# Patient Record
Sex: Male | Born: 1960 | Race: White | Hispanic: Refuse to answer | Smoking: Never smoker
Health system: Southern US, Community
[De-identification: ages and names within clinical notes are randomized; demographics above are authoritative.]

## PROBLEM LIST (undated history)

## (undated) DIAGNOSIS — H9319 Tinnitus, unspecified ear: Secondary | ICD-10-CM

## (undated) DIAGNOSIS — N189 Chronic kidney disease, unspecified: Secondary | ICD-10-CM

## (undated) DIAGNOSIS — E119 Type 2 diabetes mellitus without complications: Secondary | ICD-10-CM

## (undated) DIAGNOSIS — E785 Hyperlipidemia, unspecified: Secondary | ICD-10-CM

## (undated) DIAGNOSIS — I10 Essential (primary) hypertension: Secondary | ICD-10-CM

## (undated) DIAGNOSIS — B20 Human immunodeficiency virus [HIV] disease: Secondary | ICD-10-CM

## (undated) HISTORY — DX: Tinnitus, unspecified ear: H93.19

## (undated) HISTORY — DX: Essential (primary) hypertension: I10

## (undated) HISTORY — PX: MICRODISCECTOMY LUMBAR: SUR864

## (undated) HISTORY — DX: Type 2 diabetes mellitus without complications: E11.9

## (undated) HISTORY — DX: Chronic kidney disease, unspecified: N18.9

## (undated) HISTORY — DX: Hyperlipidemia, unspecified: E78.5

## (undated) HISTORY — DX: Human immunodeficiency virus (HIV) disease: B20

---

## 2003-09-16 ENCOUNTER — Emergency Department: Admit: 2003-09-16 | Payer: Self-pay | Source: Emergency Department | Admitting: Internal Medicine

## 2008-01-29 ENCOUNTER — Ambulatory Visit
Admit: 2008-01-29 | Disposition: A | Discharge status: Home / Self Care | Payer: Self-pay | Source: Ambulatory Visit | Admitting: Specialist

## 2009-07-15 ENCOUNTER — Ambulatory Visit: Admit: 2009-07-15 | Disposition: A | Discharge status: Home / Self Care | Payer: Self-pay | Source: Ambulatory Visit

## 2009-07-15 LAB — CBC AND DIFFERENTIAL
Basophils Absolute: 0 /mm3 (ref 0.0–0.2)
Basophils: 1 % (ref 0–2)
Eosinophils Absolute: 0.2 /mm3 (ref 0.0–0.7)
Eosinophils: 3 % (ref 0–5)
Granulocytes Absolute: 4.8 /mm3 (ref 1.8–8.1)
Hematocrit: 45.1 % (ref 42.0–52.0)
Hgb: 16 G/DL (ref 13.0–17.0)
Immature Granulocytes Absolute: 0 CUMM (ref 0.0–0.0)
Immature Granulocytes: 0 % (ref 0–1)
Lymphocytes Absolute: 2.2 /mm3 (ref 0.5–4.4)
Lymphocytes: 28 % (ref 15–41)
MCH: 34.9 PG — ABNORMAL HIGH (ref 28.0–32.0)
MCHC: 35.5 G/DL (ref 32.0–36.0)
MCV: 98.5 FL (ref 80.0–100.0)
MPV: 12.2 FL (ref 9.4–12.3)
Monocytes Absolute: 0.6 /mm3 (ref 0.0–1.2)
Monocytes: 8 % (ref 0–11)
Neutrophils %: 61 % (ref 52–75)
Platelets: 174 /mm3 (ref 140–400)
RBC: 4.58 /mm3 — ABNORMAL LOW (ref 4.70–6.00)
RDW: 13.5 % (ref 11.5–15.0)
WBC: 7.79 /mm3 (ref 3.50–10.80)

## 2009-07-15 LAB — LIPID PANEL
Cholesterol: 166 mg/dL (ref ?–199)
HDL: 48 mg/dL (ref 40–65)
LDL Calculated: 84 mg/dL (ref 0–99)
Triglycerides: 170 mg/dL — ABNORMAL HIGH (ref 34–149)
VLDL Calculated: 34 mg/dL — ABNORMAL HIGH (ref 7–30)

## 2009-07-15 LAB — COMPREHENSIVE METABOLIC PANEL
ALT: 33 U/L (ref 7–56)
AST (SGOT): 26 U/L (ref 5–40)
Albumin/Globulin Ratio: 1.8 (ref 1.1–1.8)
Albumin: 4.8 G/DL (ref 3.7–5.1)
Alkaline Phosphatase: 168 U/L — ABNORMAL HIGH (ref 43–122)
BUN: 14 MG/DL (ref 7–21)
Bilirubin, Total: 0.5 MG/DL (ref 0.2–1.3)
CO2: 29 MEQ/L (ref 22–31)
Calcium: 9.5 MG/DL (ref 8.6–10.2)
Chloride: 103 MEQ/L (ref 98–107)
Creatinine: 1 MG/DL (ref 0.5–1.4)
Globulin: 2.7 G/DL (ref 2.0–3.7)
Glucose: 142 MG/DL — ABNORMAL HIGH (ref 65–110)
Potassium: 4.3 MEQ/L (ref 3.6–5.0)
Protein, Total: 7.5 G/DL (ref 6.0–8.0)
Sodium: 143 MEQ/L (ref 136–143)

## 2009-07-15 LAB — CH/HDL: Cholesterol / HDL Ratio: 3.5 RATIO

## 2009-07-15 LAB — GFR

## 2009-07-18 LAB — HIV-1 RNA, QUANTITATIVE, PCR-SOFT

## 2009-07-18 LAB — T-HELPER CELLS (CD4) COUNT

## 2009-09-29 ENCOUNTER — Ambulatory Visit: Admit: 2009-09-29 | Disposition: A | Discharge status: Home / Self Care | Payer: Self-pay | Source: Ambulatory Visit

## 2009-09-29 LAB — CBC
Hematocrit: 46.5 % (ref 42.0–52.0)
Hgb: 16.6 g/dL (ref 13.0–17.0)
MCH: 35.3 pg — ABNORMAL HIGH (ref 28.0–32.0)
MCHC: 35.7 g/dL (ref 32.0–36.0)
MCV: 98.9 fL (ref 80.0–100.0)
MPV: 12.5 fL — ABNORMAL HIGH (ref 9.4–12.3)
Platelets: 243 10*3/uL (ref 140–400)
RBC: 4.7 10*6/uL (ref 4.70–6.00)
RDW: 13 % (ref 12–15)
WBC: 7.78 10*3/uL (ref 3.50–10.80)

## 2009-09-30 LAB — HIV-1 RNA, QUANTITATIVE, PCR-SOFT: HIV-1 RNA Quantification, P: DETECTED copies/mL

## 2009-12-09 ENCOUNTER — Ambulatory Visit: Admit: 2009-12-09 | Disposition: A | Discharge status: Home / Self Care | Payer: Self-pay | Source: Ambulatory Visit

## 2009-12-09 LAB — COMPREHENSIVE METABOLIC PANEL
ALT: 51 U/L (ref 7–56)
AST (SGOT): 28 U/L (ref 5–40)
Albumin/Globulin Ratio: 1.8 (ref 1.1–1.8)
Albumin: 4.8 g/dL (ref 3.7–5.1)
Alkaline Phosphatase: 203 U/L — ABNORMAL HIGH (ref 43–122)
BUN: 13 mg/dL (ref 7–21)
Bilirubin, Total: 2.6 mg/dL — ABNORMAL HIGH (ref 0.2–1.3)
CO2: 29 mEq/L (ref 22–31)
Calcium: 9.4 mg/dL (ref 8.6–10.2)
Chloride: 103 mEq/L (ref 98–107)
Creatinine: 0.9 mg/dL (ref 0.5–1.4)
Globulin: 2.7 g/dL (ref 2.0–3.7)
Glucose: 185 mg/dL — ABNORMAL HIGH (ref 70–100)
Potassium: 4.2 mEq/L (ref 3.6–5.0)
Protein, Total: 7.5 g/dL (ref 6.0–8.0)
Sodium: 144 mEq/L — ABNORMAL HIGH (ref 136–143)

## 2009-12-09 LAB — CBC AND DIFFERENTIAL
Baso(Absolute): 0.04 10*3/uL (ref 0.00–0.20)
Basophils: 1 % (ref 0–2)
Eosinophils Absolute: 0.3 10*3/uL (ref 0.00–0.70)
Eosinophils: 4 % (ref 0–5)
Hematocrit: 48.4 % (ref 42.0–52.0)
Hgb: 17.8 g/dL — ABNORMAL HIGH (ref 13.0–17.0)
Immature Granulocytes Absolute: 0.02 10*3/uL
Immature Granulocytes: 0 % (ref 0–1)
Lymphocytes Absolute: 2.1 10*3/uL (ref 0.50–4.40)
Lymphocytes: 30 % (ref 15–41)
MCH: 35.7 pg — ABNORMAL HIGH (ref 28.0–32.0)
MCHC: 36.8 g/dL — ABNORMAL HIGH (ref 32.0–36.0)
MCV: 97 fL (ref 80.0–100.0)
MPV: 12.6 fL — ABNORMAL HIGH (ref 9.4–12.3)
Monocytes Absolute: 0.6 10*3/uL (ref 0.00–1.20)
Monocytes: 8 % (ref 0–11)
Neutrophils Absolute: 4.01 10*3/uL
Neutrophils: 57 % (ref 52–75)
Platelets: 207 10*3/uL (ref 140–400)
RBC: 4.99 10*6/uL (ref 4.70–6.00)
RDW: 13 % (ref 12–15)
WBC: 7.05 10*3/uL (ref 3.50–10.80)

## 2009-12-09 LAB — LIPID PANEL
Cholesterol / HDL Ratio: 3.5 Index
Cholesterol: 150 mg/dL (ref 0–199)
HDL: 43 mg/dL (ref >40)
LDL Calculated: 70 mg/dL (ref 0–99)
Triglycerides: 184 mg/dL — ABNORMAL HIGH (ref 34–149)
VLDL Calculated: 37 mg/dL (ref 10–40)

## 2009-12-09 LAB — GFR: EGFR: >60

## 2009-12-09 LAB — HEMOLYSIS INDEX: Hemolysis Index: 16 Index (ref 0–18)

## 2009-12-09 LAB — CK: Creatine Kinase (CK): 100 U/L (ref 0–297)

## 2009-12-11 LAB — T-HELPER CELLS (CD4) COUNT
CD3 % (T Cells): 68 % (ref 59–83)
CD3 (T Cells): 1597 cells/mcL (ref 677–2383)
CD4 % (Helper Cells): 36 % (ref 31–59)
CD4 (Helper Cells): 831 cells/mcL (ref 424–1509)
CD45 Lymphocyte Count Flowcytometry: 2.34 10*3/uL (ref 0.99–3.15)
CD8 % (Suppressor Cells): 32 % (ref 12–38)
CD8 (Suppressor Cells): 739 cells/mcL (ref 169–955)
Helper / Suppressor Ratio: 1.12 (ref >=1.0)

## 2009-12-12 LAB — HIV-1 RNA, QUANTITATIVE, PCR-SOFT: HIV-1 RNA Quantification, P: NOT DETECTED copies/mL

## 2010-03-14 ENCOUNTER — Ambulatory Visit
Admit: 2010-03-14 | Disposition: A | Discharge status: Home / Self Care | Payer: Self-pay | Source: Ambulatory Visit | Admitting: Infectious Disease

## 2013-03-23 ENCOUNTER — Ambulatory Visit (INDEPENDENT_AMBULATORY_CARE_PROVIDER_SITE_OTHER): Payer: Commercial Managed Care - POS | Admitting: Family Medicine

## 2013-03-23 ENCOUNTER — Encounter (INDEPENDENT_AMBULATORY_CARE_PROVIDER_SITE_OTHER): Payer: Self-pay

## 2013-03-23 VITALS — BP 148/96 | HR 79 | Temp 95.3°F | Resp 17 | Ht 70.0 in | Wt 158.0 lb

## 2013-03-23 MED ORDER — CIPROFLOXACIN-HYDROCORTISONE 0.2-1 % OT SUSP
3.0000 [drp] | Freq: Two times a day (BID) | OTIC | Status: AC
Start: 2013-03-23 — End: 2013-03-30

## 2013-03-23 NOTE — Patient Instructions (Signed)
External Ear Infection [Adult]    This is an infection in the ear canal due to an overgrowth of bacteria or fungus. This often occurs a few days after water gets trapped in the ear canal (swimming or bathing). It may also occur after cleaning too deeply in the ear canal with a cotton swab or other object. Sometimes hair care products get into the ear canal and cause this problem.  There may be itching, redness, drainage, or swelling of the ear canal and temporary loss of hearing.  Home Care:   Do not try to clean the ear canal. That could push pus and bacteria deeper into the canal.   Use the drops prescribed to reduce swelling and fight the infection. If an EAR WICK was placed in the ear canal, apply drops right onto the end of the wick. The wick will draw the medicine into the ear canal even if it is swollen closed.   Do not allow water to get into your ear when bathing. No swimming during this time.   A cotton ball may be loosely placed in the outer ear to absorb any drainage.   You may use acetaminophen (Tylenol) or ibuprofen (Motrin, Advil) to control pain, unless another medicine was prescribed. [NOTE: If you have chronic liver or kidney disease or ever had a stomach ulcer or GI bleeding, talk with your doctor before using these medicines.]  Preventing Future Infections:  You can usually avoid this problem by using an eardrop that removes the water from your ear canal when you feel there is water trapped there. You can get these drops over the counter (Swim Ear, Aqua Ear and other brands).  Follow Up  with your doctor or this facility in one week or as instructed by our staff.  Get Prompt Medical Attention  if any of the following occur:   Ear pain becomes worse or does not begin to improve after 3 days of treatment   Redness or swelling of the outer ear occurs or gets worse   Headache, painful or stiff neck,   Feeling drowsy or confused   Fever of 100.4F (38C) or higher, or as directed by your  healthcare provider   Seizure   2000-2014 [employer], [address], [place] [address]. All rights reserved. This information is not intended as a substitute for professional medical care. Always follow your healthcare professional's instructions.

## 2013-03-23 NOTE — Progress Notes (Signed)
Subjective:       Patient ID: James Day is a 52 y.o. male.    HPI    52yo wm presents with 5 days of left ear pain and discomfort.  Used debrox, as he has had similar problems in past and that always seemed to help, but this time no relief.  Noticed ringing yesterday.  No hearing loss. O/W feels well.  Has not been swimming recently.    The following portions of the patient's history were reviewed and updated as appropriate: allergies, current medications, past family history, past medical history, past surgical history and problem list.    Review of Systems   Constitutional: Negative for fever.   HENT: Negative for hearing loss, congestion, sore throat, rhinorrhea and ear discharge.            Objective:     Physical Exam   Constitutional: He appears well-developed and well-nourished. No distress.   HENT:   Head: Normocephalic and atraumatic.   Right Ear: Tympanic membrane and external ear normal.   Left Ear: Tympanic membrane normal. There is swelling (and mild erythema of canal, with small amount white/yellow debris). No tenderness. No mastoid tenderness. Tympanic membrane is not injected and not erythematous.  No middle ear effusion.   Nose: Nose normal.   Mouth/Throat: Oropharynx is clear and moist.   Eyes: Conjunctivae normal are normal.   Neck: Normal range of motion. Neck supple.   Lymphadenopathy:     He has no cervical adenopathy.           Assessment:       1. Otitis externa, left  ciprofloxacin-hydrocortisone (CIPRO HC) otic suspension           Plan:       F/u if no improvement or any worsening over next 2-3 days

## 2013-04-22 ENCOUNTER — Other Ambulatory Visit: Payer: Self-pay | Admitting: Otolaryngology

## 2013-04-24 ENCOUNTER — Ambulatory Visit: Payer: Commercial Managed Care - POS | Attending: Otolaryngology

## 2013-04-24 DIAGNOSIS — R42 Dizziness and giddiness: Secondary | ICD-10-CM | POA: Insufficient documentation

## 2013-04-24 DIAGNOSIS — K119 Disease of salivary gland, unspecified: Secondary | ICD-10-CM | POA: Insufficient documentation

## 2013-04-24 MED ORDER — GADOBUTROL 1 MMOL/ML IV SOLN
8.0000 mL | Freq: Once | INTRAVENOUS | Status: AC | PRN
Start: 2013-04-24 — End: 2013-04-24
  Administered 2013-04-24: 8 mmol via INTRAVENOUS
  Filled 2013-04-24: qty 10

## 2013-05-09 ENCOUNTER — Emergency Department: Payer: Commercial Managed Care - POS

## 2013-05-09 ENCOUNTER — Emergency Department
Admission: EM | Admit: 2013-05-09 | Discharge: 2013-05-09 | Disposition: A | Discharge status: Home / Self Care | Payer: Commercial Managed Care - POS | Attending: Emergency Medical Services | Admitting: Emergency Medical Services

## 2013-05-09 DIAGNOSIS — E785 Hyperlipidemia, unspecified: Secondary | ICD-10-CM | POA: Insufficient documentation

## 2013-05-09 DIAGNOSIS — Z21 Asymptomatic human immunodeficiency virus [HIV] infection status: Secondary | ICD-10-CM | POA: Insufficient documentation

## 2013-05-09 DIAGNOSIS — X500XXA Overexertion from strenuous movement or load, initial encounter: Secondary | ICD-10-CM | POA: Insufficient documentation

## 2013-05-09 DIAGNOSIS — IMO0002 Reserved for concepts with insufficient information to code with codable children: Secondary | ICD-10-CM | POA: Insufficient documentation

## 2013-05-09 DIAGNOSIS — E119 Type 2 diabetes mellitus without complications: Secondary | ICD-10-CM | POA: Insufficient documentation

## 2013-05-09 DIAGNOSIS — N189 Chronic kidney disease, unspecified: Secondary | ICD-10-CM | POA: Insufficient documentation

## 2013-05-09 DIAGNOSIS — Z7982 Long term (current) use of aspirin: Secondary | ICD-10-CM | POA: Insufficient documentation

## 2013-05-09 DIAGNOSIS — I129 Hypertensive chronic kidney disease with stage 1 through stage 4 chronic kidney disease, or unspecified chronic kidney disease: Secondary | ICD-10-CM | POA: Insufficient documentation

## 2013-05-09 MED ORDER — HYDROCODONE-ACETAMINOPHEN 5-300 MG PO TABS
1.0000 | ORAL_TABLET | Freq: Four times a day (QID) | ORAL | Status: DC | PRN
Start: 2013-05-09 — End: 2014-01-31

## 2013-05-09 NOTE — ED Provider Notes (Addendum)
Physician/Midlevel provider first contact with patient: 05/09/13 0934     52 yom, c/o right groin pain that radiates to right thigh and buttock x 3 months and worsening.  Pt reports walking on a marble stone in [place] 3 months ago, sprained the right hip and pain ever since.  Denies recent back pain or injury, urinary or bowel dysfunction, muscle wasting or weakness, or paresthesias, is ambulatory but limping lately.     History     Chief Complaint   Patient presents with   . Leg Pain     Patient is a 52 y.o. male presenting with leg pain. The history is provided by the patient. No language interpreter was used.   Leg Pain   The incident occurred more than 1 week ago. The injury mechanism was torsion. The pain is present in the right hip. The quality of the pain is described as burning and aching. The pain is at a severity of 6/10. The pain is moderate. The pain has been intermittent since onset. Pertinent negatives include no numbness, no inability to bear weight, no loss of motion, no muscle weakness and no tingling. The symptoms are aggravated by bearing weight and activity. He has tried NSAIDs and ice for the symptoms. The treatment provided mild relief.       Past Medical History   Diagnosis Date   . Diabetes mellitus    . Hypertension    . Hyperlipidemia    . Chronic kidney disease    . HIV disease    . Tinnitus        Past Surgical History   Procedure Date   . Microdiscectomy lumbar        Family History   Problem Relation Age of Onset   . Stroke Mother        Social  History   Substance Use Topics   . Smoking status: Never Smoker    . Smokeless tobacco: Not on file   . Alcohol Use: No       .     No Known Allergies    Current/Home Medications    ABACAVIR-LAMIVUDINE (EPIZICOM) 600-300 MG PER TABLET    Take 1 tablet by mouth daily.    ASPIRIN 81 MG TABLET    Take 81 mg by mouth daily.    ATAZANAVIR (REYATAZ) 300 MG CAPSULE    Take 300 mg by mouth every morning with breakfast.    ATORVASTATIN (LIPITOR) 10 MG  TABLET    Take 10 mg by mouth daily.    CALCIUM-VITAMIN D (OSCAL-500) 500-200 MG-UNIT PER TABLET    Take 1 tablet by mouth 2 (two) times daily.    LINAGLIPTIN (TRADJENTA) 5 MG TAB    Take by mouth.    MULTIPLE VITAMIN (MULTIVITAMIN) TABLET    Take 1 tablet by mouth daily.    PREDNISONE (DELTASONE) 10 MG TABLET PACK    Take by mouth daily.    RITONAVIR (NORVIR) 100 MG CAPSULE    Take by mouth 2 (two) times daily.    VALACYCLOVIR HCL (VALTREX PO)    Take by mouth.    VALSARTAN-HYDROCHLOROTHIAZIDE (DIOVAN-HCT) 160-12.5 MG PER TABLET    Take 1 tablet by mouth daily.        Review of Systems   Constitutional: Negative for fever, chills, activity change and fatigue.   HENT: Negative for congestion, sore throat, rhinorrhea, trouble swallowing, neck pain and neck stiffness.    Eyes: Negative for visual disturbance.   Respiratory: Negative  for cough and shortness of breath.    Cardiovascular: Negative for chest pain.   Gastrointestinal: Negative for nausea, vomiting and abdominal pain.   Genitourinary: Negative for dysuria and difficulty urinating.   Musculoskeletal: Positive for joint swelling and gait problem. Negative for back pain.   Skin: Negative for color change and rash.   Neurological: Negative for dizziness, tingling, weakness, numbness and headaches.   Psychiatric/Behavioral: Negative for behavioral problems and confusion.       Physical Exam    BP 117/60  Pulse 86  Temp 98.1 F (36.7 C) (Oral)  Resp 18  Ht 1.778 m  Wt 72.576 kg  BMI 22.96 kg/m2  SpO2 99%    Physical Exam   Constitutional: He is oriented to person, place, and time. He appears well-developed and well-nourished. No distress.   HENT:   Head: Normocephalic and atraumatic.   Eyes: Conjunctivae normal and EOM are normal. No scleral icterus.   Neck: Normal range of motion. Neck supple.   Pulmonary/Chest: Effort normal. No respiratory distress.   Musculoskeletal: He exhibits tenderness. He exhibits no edema.        Right hip: He exhibits decreased  range of motion, tenderness and bony tenderness. He exhibits normal strength, no crepitus and no deformity.   Neurological: He is alert and oriented to person, place, and time.   Skin: Skin is warm and dry. No rash noted. No erythema.   Psychiatric: He has a normal mood and affect. His behavior is normal.       MDM and ED Course     ED Medication Orders     None           MDM      Procedures    Clinical Impression & Disposition     Clinical Impression  Final diagnoses:   Hip strain, right, initial encounter        ED Disposition     Discharge Chrystie Nose discharge to home/self care.    Condition at disposition: Stable             New Prescriptions    HYDROCODONE-ACETAMINOPHEN (VICODIN) 5-300 MG TAB    Take 1-2 tablets by mouth every 6 (six) hours as needed.      Clinical Course / MDM     Working Differential (not completely inclusive):       Notes:       Discussion of abnormal results/incidental findings:         Data Review     Nursing records reviewed and agree: Yes    Laboratory results reviewed by ED provider: No    Radiologic study results reviewed by ED provider: Yes  Radiologic Studies Interpreted (viewed) by ED provider: Yes    Rendering Provider: Georga Hacking      Signout     Patient signed out to:      Signout notes:                           Georga Hacking, PA  05/09/13 1028    Georga Hacking, [place]  05/09/13 1126

## 2013-05-09 NOTE — ED Notes (Signed)
Pt states pain to the right leg at the top that begins as a sharp pain and continues down the leg and becomes lower leg tightness. Pt states pain began months ago but became much worse on Thursday. Pt states no injury or trauma. Pt states limited ambulation. Pt states no loss of bowel or bladder control. Pt in NAD and a+oX3.

## 2013-05-09 NOTE — ED Provider Notes (Signed)
Review of MLP Charts: I have reviewed the history, physical exam, clinical impression(s), & plan AND agree.      Pamala Hurry, MD  05/09/13 [phone]

## 2013-05-09 NOTE — ED Provider Notes (Signed)
Review of MLP Charts: I have reviewed the history, physical exam, clinical impression(s), & plan AND agree.      Pamala Hurry, MD  05/09/13 [phone]

## 2014-01-31 ENCOUNTER — Emergency Department: Payer: Commercial Managed Care - POS

## 2014-01-31 ENCOUNTER — Emergency Department
Admission: EM | Admit: 2014-01-31 | Discharge: 2014-01-31 | Disposition: A | Discharge status: Home / Self Care | Payer: Commercial Managed Care - POS | Attending: Emergency Medical Services | Admitting: Emergency Medical Services

## 2014-01-31 DIAGNOSIS — I129 Hypertensive chronic kidney disease with stage 1 through stage 4 chronic kidney disease, or unspecified chronic kidney disease: Secondary | ICD-10-CM | POA: Insufficient documentation

## 2014-01-31 DIAGNOSIS — E785 Hyperlipidemia, unspecified: Secondary | ICD-10-CM | POA: Insufficient documentation

## 2014-01-31 DIAGNOSIS — B20 Human immunodeficiency virus [HIV] disease: Secondary | ICD-10-CM | POA: Insufficient documentation

## 2014-01-31 DIAGNOSIS — N23 Unspecified renal colic: Secondary | ICD-10-CM | POA: Insufficient documentation

## 2014-01-31 DIAGNOSIS — N183 Chronic kidney disease, stage 3 unspecified: Secondary | ICD-10-CM | POA: Insufficient documentation

## 2014-01-31 DIAGNOSIS — E119 Type 2 diabetes mellitus without complications: Secondary | ICD-10-CM | POA: Insufficient documentation

## 2014-01-31 LAB — CBC AND DIFFERENTIAL
Basophils Absolute Automated: 0.02 10*3/uL (ref 0.00–0.20)
Basophils Automated: 0 %
Eosinophils Absolute Automated: 0.04 10*3/uL (ref 0.00–0.70)
Eosinophils Automated: 0 %
Hematocrit: 40.9 % — ABNORMAL LOW (ref 42.0–52.0)
Hgb: 14.1 g/dL (ref 13.0–17.0)
Immature Granulocytes Absolute: 0.05 10*3/uL
Immature Granulocytes: 0 %
Lymphocytes Absolute Automated: 1.43 10*3/uL (ref 0.50–4.40)
Lymphocytes Automated: 10 %
MCH: 35.6 pg — ABNORMAL HIGH (ref 28.0–32.0)
MCHC: 34.5 g/dL (ref 32.0–36.0)
MCV: 103.3 fL — ABNORMAL HIGH (ref 80.0–100.0)
MPV: 11.7 fL (ref 9.4–12.3)
Monocytes Absolute Automated: 1.37 10*3/uL — ABNORMAL HIGH (ref 0.00–1.20)
Monocytes: 10 %
Neutrophils Absolute: 11.56 10*3/uL — ABNORMAL HIGH (ref 1.80–8.10)
Neutrophils: 80 %
Nucleated RBC: 0 /100 WBC (ref 0–1)
Platelets: 193 10*3/uL (ref 140–400)
RBC: 3.96 10*6/uL — ABNORMAL LOW (ref 4.70–6.00)
RDW: 14 % (ref 12–15)
WBC: 14.42 10*3/uL — ABNORMAL HIGH (ref 3.50–10.80)

## 2014-01-31 LAB — URINALYSIS, REFLEX TO MICROSCOPIC EXAM IF INDICATED
Bilirubin, UA: NEGATIVE
Glucose, UA: 150 — AB
Ketones UA: NEGATIVE
Leukocyte Esterase, UA: NEGATIVE
Nitrite, UA: NEGATIVE
Protein, UR: NEGATIVE
Specific Gravity UA: 1.013 (ref 1.001–1.035)
Urine pH: 6 (ref 5.0–8.0)
Urobilinogen, UA: NORMAL mg/dL

## 2014-01-31 LAB — COMPREHENSIVE METABOLIC PANEL
ALT: 19 U/L (ref 0–55)
AST (SGOT): 21 U/L (ref 5–34)
Albumin/Globulin Ratio: 1.6 (ref 0.9–2.2)
Albumin: 4.5 g/dL (ref 3.5–5.0)
Alkaline Phosphatase: 75 U/L (ref 38–106)
Anion Gap: 11 (ref 5.0–15.0)
BUN: 27 mg/dL (ref 9–28)
Bilirubin, Total: 1.9 mg/dL — ABNORMAL HIGH (ref 0.2–1.2)
CO2: 23 mEq/L (ref 22–29)
Calcium: 9.7 mg/dL (ref 8.5–10.5)
Chloride: 103 mEq/L (ref 100–111)
Creatinine: 2.4 mg/dL — ABNORMAL HIGH (ref 0.7–1.3)
Globulin: 2.9 g/dL (ref 2.0–3.6)
Glucose: 169 mg/dL — ABNORMAL HIGH (ref 70–100)
Potassium: 3.9 mEq/L (ref 3.5–5.1)
Protein, Total: 7.4 g/dL (ref 6.0–8.3)
Sodium: 137 mEq/L (ref 136–145)

## 2014-01-31 LAB — LACTIC ACID, PLASMA: Lactic Acid: 1.3 mmol/L (ref 0.5–2.2)

## 2014-01-31 LAB — GFR: EGFR: 28.4

## 2014-01-31 LAB — LIPASE: Lipase: 169 U/L — ABNORMAL HIGH (ref 8–78)

## 2014-01-31 MED ORDER — ONDANSETRON 4 MG PO TBDP
4.0000 mg | ORAL_TABLET | Freq: Four times a day (QID) | ORAL | Status: DC | PRN
Start: 2014-01-31 — End: 2015-02-12

## 2014-01-31 MED ORDER — SODIUM CHLORIDE 0.9 % IV BOLUS
1000.0000 mL | Freq: Once | INTRAVENOUS | Status: AC
Start: 2014-01-31 — End: 2014-01-31
  Administered 2014-01-31: 1000 mL via INTRAVENOUS

## 2014-01-31 MED ORDER — IOHEXOL 300 MG/ML IJ SOLN
30.0000 mL | Freq: Once | INTRAMUSCULAR | Status: AC
Start: 2014-01-31 — End: 2014-01-31
  Administered 2014-01-31: 30 mL via ORAL

## 2014-01-31 MED ORDER — HYDROMORPHONE HCL PF 1 MG/ML IJ SOLN
1.0000 mg | Freq: Once | INTRAMUSCULAR | Status: AC
Start: 2014-01-31 — End: 2014-01-31
  Administered 2014-01-31: 1 mg via INTRAVENOUS
  Filled 2014-01-31: qty 1

## 2014-01-31 MED ORDER — CIPROFLOXACIN IN D5W 400 MG/200ML IV SOLN
400.0000 mg | Freq: Two times a day (BID) | INTRAVENOUS | Status: DC
Start: 2014-01-31 — End: 2014-01-31

## 2014-01-31 MED ORDER — ONDANSETRON HCL 4 MG/2ML IJ SOLN
4.0000 mg | Freq: Once | INTRAMUSCULAR | Status: AC
Start: 2014-01-31 — End: 2014-01-31
  Administered 2014-01-31: 4 mg via INTRAVENOUS
  Filled 2014-01-31: qty 2

## 2014-01-31 MED ORDER — HYDROCODONE-ACETAMINOPHEN 5-325 MG PO TABS
1.0000 | ORAL_TABLET | Freq: Four times a day (QID) | ORAL | Status: DC | PRN
Start: 2014-01-31 — End: 2017-12-12

## 2014-01-31 MED ORDER — IOHEXOL 300 MG/ML IJ SOLN
30.0000 mL | Freq: Once | INTRAMUSCULAR | Status: DC
Start: 2014-01-31 — End: 2014-01-31

## 2014-01-31 NOTE — ED Provider Notes (Signed)
Physician/Midlevel provider first contact with patient: 01/31/14 0831       EMERGENCY DEPARTMENT HISTORY AND PHYSICAL EXAM      Patient Information     Patient Name: James Day, James Day  Encounter Date:  01/31/2014  Patient DOB:  10-27-1960  MRN:  [id number]  Room:  06/A06  Rendering Provider: Petra Kuba, PA-C    History of Presenting Illness     Chief Complaint: *abd pain  Historian: patient  Onset: gradual  Quality: severe, sharp, non radiating, constant  Location: LLQ   Duration: 15hrs      HPI Comments:   53 y.o. male HIV+ with stage 3 kidney disease presents with severe, nonradiating, sharp LLQ pain that began last night after dinner. Associated with mild nausea. Normal BM last night, nonbloody. No melena. No back pain, leg weakness, flank pain, hematuria, or GU complaints. No fever/chills. No history of the same. Last colonoscopy this April with benign polyp removed.       PMD: Rayna Sexton, MD    Past Medical History     Past Medical History   Diagnosis Date   . Diabetes mellitus    . Hypertension    . Hyperlipidemia    . Chronic kidney disease    . HIV disease    . Tinnitus        Past Surgical History     Past Surgical History   Procedure Laterality Date   . Microdiscectomy lumbar         Family History     Family History   Problem Relation Age of Onset   . Stroke Mother        Social History     History     Social History   . Marital Status: Significant Other     Spouse Name: N/A     Number of Children: N/A   . Years of Education: N/A     Social History Main Topics   . Smoking status: Never Smoker    . Smokeless tobacco: Not on file   . Alcohol Use: No   . Drug Use: No   . Sexual Activity: Not on file     Other Topics Concern   . Not on file     Social History Narrative       Allergies     No Known Allergies    Home Medications     Prior to Admission medications    Medication Sig Start Date End Date Taking? Authorizing Provider   abacavir-lamiVUDine (EPIZICOM) 600-300 MG per tablet Take 1 tablet by mouth  daily.    [provider]   aspirin 81 MG tablet Take 81 mg by mouth daily.    [provider]   atazanavir (REYATAZ) 300 MG capsule Take 300 mg by mouth every morning with breakfast.    [provider]   atorvastatin (LIPITOR) 10 MG tablet Take 10 mg by mouth daily.    [provider]   calcium-vitamin D (OSCAL-500) 500-200 MG-UNIT per tablet Take 1 tablet by mouth 2 (two) times daily.    [provider]   Hydrocodone-Acetaminophen (VICODIN) 5-300 MG Tab Take 1-2 tablets by mouth every 6 (six) hours as needed. 05/09/13   Georga Hacking, PA   Linagliptin (TRADJENTA) 5 MG Tab Take by mouth.    [provider]   Multiple Vitamin (MULTIVITAMIN) tablet Take 1 tablet by mouth daily.    [provider]   predniSONE (DELTASONE) 10 MG tablet pack  Take by mouth daily.    [provider]   ritonavir (NORVIR) 100 MG capsule Take by mouth 2 (two) times daily.    [provider]   ValACYclovir HCl (VALTREX PO) Take by mouth.    [provider]   valsartan-hydrochlorothiazide (DIOVAN-HCT) 160-12.5 MG per tablet Take 1 tablet by mouth daily.    [provider]         Review of Systems     General: No fever, chills, night sweats.  Skin: No rash or abnormal bruises.  Head: No history of trauma or headache.   EENT: No acute visual loss or blurry vision.   Cardiac: No chest pain, palpitations, DOE, orthopnea, PND or edema  Respiratory: No SOB, wheezing, cough, sputum, hemoptysis  GI: No vomiting, diarrhea. No abd pain. No hematemesis, melena, hematechezia.  GU: No frequency, urgency, dysuria, hematuria. No polyuria, nocturia. No incontinence.  Vascular: No edema  Musculoskeletal: No muscle weakness or atrophy. No joint pain, stiffness, or swelling.  Neurologic: No numbness, tingling, tremors, weakness, fainting, falls, seizures.  Endocrine:+DM  Psych: No SI/HI.      Physical Exam     Patient Vitals for the past 24 hrs:   BP Temp Pulse Resp  SpO2   01/31/14 1130 - - 68 - 98 %   01/31/14 1118 167/90 mmHg - 72 - 99 %   01/31/14 1017 158/87 mmHg - 80 16 99 %   01/31/14 0905 151/74 mmHg - 80 16 96 %   01/31/14 0828 179/93 mmHg 97.7 F (36.5 C) 101 18 99 %       Constitutional:  Well developed, well nourished.  Moderate pain. Nontoxic.  Head:  Normocephalic. Atraumatic.    ENT:  PERRL.  EOMI.  Mucous membranes are moist and intact.  Sclera Anicteric.   Neck: Supple.  Full ROM.   No meningismus.   Cardiovascular: tachy rate.  Regular rhythm.   Pulmonary/Chest: No evidence of respiratory distress.Chest is symmetrical with respirations and  clear to auscultation bilaterally. No wheezing, rales or rhonchi.  Abdominal: +LLQ pain to palpation.  Soft and non-distended. Active bowel sounds in all 4 quadrants. No guarding or rebound tenderness. No CVA tenderness.  Musculoskeletal: no evidence of acute muscle atrophy, joint swelling, tenderness, or redness.  Vascular: No carotid or abd bruits. No JVD/Edema.  No calf tenderness. Radial/DP/PT 2+ bilaterally.  Skin: Skin is warm and dry.  No petechiae.  No purpura.    Neurological:  nonfocal. Strength is symmetric in the upper and lower extremities. Gait steady.   Psychiatric:  Good eye contact.  Normal interaction, affect, and behavior.     Orders Placed During This Encounter     Orders Placed This Encounter   Procedures   . Urine culture   . CT Abd/Pelvis with PO Contrast   . Comprehensive metabolic panel   . Lipase   . Lactic acid   . CBC and differential   . UA, Reflex to Microscopic   . GFR       ED Medications Administered     ED Medication Orders    Start     Status Ordering Provider    01/31/14 1222  sodium chloride 0.9 % bolus 1,000 mL   Once     Route: Intravenous  Ordered Dose: 1,000 mL     Last MAR action:  New Bag Orlen Leedy K    01/31/14 1221     Every 12 hours scheduled,   Status:  Discontinued  Route: Intravenous  Ordered Dose: 400 mg     Discontinued Mairyn Lenahan K    01/31/14 1020   sodium chloride 0.9 % bolus 1,000 mL   Once     Route: Intravenous  Ordered Dose: 1,000 mL     Last MAR action:  Stopped Rayshawn Maney K    01/31/14 0934  HYDROmorphone (DILAUDID) injection 1 mg   Once     Route: Intravenous  Ordered Dose: 1 mg     Last MAR action:  Given Sheppard Luckenbach K    01/31/14 0934  ondansetron (ZOFRAN) injection 4 mg   Once     Route: Intravenous  Ordered Dose: 4 mg     Last MAR action:  Given Gurpreet Mariani K    01/31/14 0928     Once,   Status:  Discontinued     Route: Oral  Ordered Dose: 30 mL     Discontinued Tyheim Vanalstyne K    01/31/14 0923  iohexol (OMNIPAQUE) 300 MG/ML injection 30 mL   Once     Route: Oral  Ordered Dose: 30 mL     Last MAR action:  Given Chase Picket    01/31/14 7106  sodium chloride 0.9 % bolus 1,000 mL   Once     Route: Intravenous  Ordered Dose: 1,000 mL     Last MAR action:  Stopped Kalief Kattner K    01/31/14 0835  HYDROmorphone (DILAUDID) injection 1 mg   Once     Route: Intravenous  Ordered Dose: 1 mg     Last MAR action:  Given Rmani Kellogg K    01/31/14 0835  ondansetron (ZOFRAN) injection 4 mg   Once     Route: Intravenous  Ordered Dose: 4 mg     Last MAR action:  Given Emile Ringgenberg K          Diagnostic Study Results and Data Review     The results of the diagnostic studies below were reviewed by the ED provider:    Labs  Results    Procedure Component Value Units Date/Time    Urine culture [[id number]] Collected:  01/31/14 0843    Specimen Information:  Urine / Urine, Clean Catch Updated:  01/31/14 1231    UA, Reflex to Microscopic [[id number]]  (Abnormal) Collected:  01/31/14 1029    Specimen Information:  Urine Updated:  01/31/14 1044     Urine Type Clean Catch      Color, UA Straw      Clarity, UA Clear      Specific Gravity UA 1.013      Urine pH 6.0      Leukocyte Esterase, UA Negative      Nitrite, UA Negative      Protein, UR Negative      Glucose, UA 150 (A)      Ketones UA Negative      Urobilinogen, UA Normal  mg/dL      Bilirubin, UA Negative      Blood, UA Moderate (A)      RBC, UA 6 - 10 (A) /hpf      WBC, UA 0 - 5 /hpf      Squamous Epithelial Cells, Urine 0 - 5 /hpf     Comprehensive metabolic panel [[id number]]  (Abnormal) Collected:  01/31/14 0847    Specimen Information:  Blood Updated:  01/31/14 0916     Glucose 169 (H) mg/dL      BUN 27 mg/dL  Creatinine 2.4 (H) mg/dL      Sodium 161 mEq/L      Potassium 3.9 mEq/L      Chloride 103 mEq/L      CO2 23 mEq/L      CALCIUM 9.7 mg/dL      Protein, Total 7.4 g/dL      Albumin 4.5 g/dL      AST (SGOT) 21 U/L      ALT 19 U/L      Alkaline Phosphatase 75 U/L      Bilirubin, Total 1.9 (H) mg/dL      Globulin 2.9 g/dL      Albumin/Globulin Ratio 1.6      Anion Gap 11.0     Lipase [[id number]]  (Abnormal) Collected:  01/31/14 0847    Specimen Information:  Blood Updated:  01/31/14 0916     Lipase 169 (H) U/L     GFR [[id number]] Collected:  01/31/14 0847     EGFR 28.4 Updated:  01/31/14 0916    Lactic acid [[id number]] Collected:  01/31/14 0848    Specimen Information:  Blood Updated:  01/31/14 0913     Lactic acid 1.3 mmol/L     CBC and differential [[id number]]  (Abnormal) Collected:  01/31/14 0846    Specimen Information:  Blood / Blood Updated:  01/31/14 0901     WBC 14.42 (H) x10 3/uL      RBC 3.96 (L) x10 6/uL      Hgb 14.1 g/dL      Hematocrit 52.8 (L) %      MCV 103.3 (H) fL      MCH 35.6 (H) pg      MCHC 34.5 g/dL      RDW 14 %      Platelets 193 x10 3/uL      MPV 11.7 fL      Neutrophils 80 %      Lymphocytes Automated 10 %      Monocytes 10 %      Eosinophils Automated 0 %      Basophils Automated 0 %      Immature Granulocyte 0 %      Nucleated RBC 0 /100 WBC      Neutrophils Absolute 11.56 (H) x10 3/uL      Abs Lymph Automated 1.43 x10 3/uL      Abs Mono Automated 1.37 (H) x10 3/uL      Abs Eos Automated 0.04 x10 3/uL      Absolute Baso Automated 0.02 x10 3/uL      Absolute Immature Granulocyte 0.05 x10 3/uL           Radiologic Studies  Radiology Results (24  Hour)    Procedure Component Value Units Date/Time    CT Abd/Pelvis with PO Contrast [[id number]] Collected:  01/31/14 1201    Order Status:  Completed  Updated:  01/31/14 1215    Narrative:      Indication: Left lower quadrant pain.    Procedure: Unenhanced CT of the abdomen and pelvis. Oral contrast  material is present. No IV contrast material. Axial images were acquired  using helical technique.    Findings: There is mild left-sided hydronephrosis. There is severe  perinephric edema on the left and the proximal left ureter is distended.  Below the level of the iliac vessels, the left ureter is not dilated. No  ureteral calculus is visible. In the lower pole the left kidney there is  a nonobstructive renal calculus measuring 2 x  2 mm. Two small  low-attenuation foci in the right kidney are likely to be benign cysts.  The right kidney is otherwise unremarkable. There is a small amount of  calcified atherosclerotic plaque. No aneurysm of the abdominal aorta.  Normal appendix. The stomach is distended. The proximal colon is  distended. No dilated small bowel loops are seen. No definite colonic  diverticula. The prostate is moderately enlarged.    Sensitivity for some abnormalities is limited without IV contrast  material. No focal liver lesions. Unremarkable gallbladder. No  dilatation of the biliary tree. A benign calcified granuloma is seen in  the spleen. Normal splenic size. Unremarkable pancreas and adrenals.  Minimal posterior basilar pulmonary opacities are probably due to  atelectasis. Included portions of the mediastinum are unremarkable.  Degenerative changes of the spine and hips are seen. A small sclerotic  focus seen in the L5 vertebral body is likely to be a benign bone  island.      Impression:      Impression:  1. There is mild left-sided hydronephrosis and distention of the  proximal left ureter, along with severe perinephric stranding. These  findings are potentially due to recent passage of a right  ureteral  calculus or are potentially due to pyelonephritis. A nonobstructive  calculus is seen in the lower pole of the left kidney. A ureteral  calculus is not visible.  2. Distention of the stomach and of the proximal colon appear to be  nonobstructive.  3. Additional minor findings are detailed above.    Wynema Birch, MD   01/31/2014 12:11 PM              Abnormal results/incidental findings discussed with pt and/or family: yes    Monitors, EKG, Procedures, Critical Care, and Splints   Na      MDM and Clinical Notes     Working Differential (not completely inclusive): Diverticulitis, ischemic colitis, colitis, renal colic, etc    Nursing records reviewed and agree: Yes    Clinical Notes:     Re-Eval: Re-eval at 1015 Patient still with pain, but more comfortable. Nontoxic. abd still tender llq, but soft. Active bs throughout    1215 Patient much more comfortable. Abd benign. No CVA tenderness or flank pain. Has urinated here.  Phone Conversation: n/a      Prescriptions       Discharge Medication List as of 01/31/2014 12:56 PM      START taking these medications    Details   HYDROcodone-acetaminophen (NORCO) 5-325 MG per tablet Take 1 tablet by mouth every 6 (six) hours as needed for Pain., Starting 01/31/2014, Until Discontinued, Print      ondansetron (ZOFRAN ODT) 4 MG disintegrating tablet Take 1 tablet (4 mg total) by mouth every 6 (six) hours as needed for Nausea., Starting 01/31/2014, Until Discontinued, Print             Diagnosis and Disposition     Clinical Impression:  1. Renal colic on left side      Final diagnoses:   Renal colic on left side       Disposition:  ED Disposition    Discharge Chrystie Nose discharge to home/self care.    Condition at disposition: Stable                  Rendering Provider: Petra Kuba, PA-C    Attending's signature signifies review of the provider note and clinical impression.  Neill Loft, [place]  01/31/14 1825    Chase Picket, MD  02/01/14 [phone]

## 2014-01-31 NOTE — ED Notes (Signed)
LLQ pain and nausea since 1730 yesterday. BM yesterday which was "light gray" in color. No urinary symptoms. Looks uncomfortable.

## 2014-01-31 NOTE — Discharge Instructions (Signed)
Flank Pain    You have been diagnosed with Flank Pain.    Flank Pain means that you are having pain in your side. Flank pain can have many causes. Some of the most common causes are:     Kidney stones.   A pulled muscle (muscle strain).   Kidney infection (pyelonephritis).   Pneumonia (pain going down from the lungs).   A viral skin disease called shingles.   A gallbladder problem (gall stones) if the pain is on your right side.   In rare cases, a more serious or dangerous condition can cause flank pain.    It is not clear today why you are having flank pain. Your doctor did an exam and a work-up and does not feel that the cause of your flank pain is life-threatening. You might need another exam or more tests to find out why you have these symptoms. At this time, the cause of your symptoms does not seem dangerous and you don t need to stay in the hospital.    We don't believe your condition is dangerous right now. However, you need to be careful. Sometimes a problem that seems small can get serious later. Therefore, it is very important for you to come back here or go to the nearest Emergency Department if you don t get better or your symptoms get worse.     Some things you can try at home are:     Get plenty of rest.   Over-the-counter pain medications like ibuprofen (Advil or Motrin), naproxen (Aleve, Naprosyn), or acetaminophen (Tylenol).   Warm compresses.   Drink lots of liquids.   Follow the instructions for any medication you get prescribed.    Follow up with a doctor right away.    YOU SHOULD SEEK MEDICAL ATTENTION IMMEDIATELY, EITHER HERE OR AT THE NEAREST EMERGENCY DEPARTMENT, IF ANY OF THE FOLLOWING OCCURS:     You have a fever (temperature higher than 100.4F or 38C).   Your pain does not go away or gets worse.   You can t keep fluids down or throw up many times.   You start having pain that spreads from your flank to the rest of your abdomen (belly).   You don t feel  better after treatment.   Any other symptoms, concerns, or not getting better as expected.    If you can t follow up with your doctor, or if at any time you feel you need to be rechecked or seen again, come back here or go to the nearest emergency department.

## 2015-02-12 ENCOUNTER — Emergency Department: Payer: Commercial Managed Care - POS

## 2015-02-12 ENCOUNTER — Emergency Department
Admission: EM | Admit: 2015-02-12 | Discharge: 2015-02-12 | Disposition: A | Discharge status: Home / Self Care | Payer: Commercial Managed Care - POS | Attending: Emergency Medical Services | Admitting: Emergency Medical Services

## 2015-02-12 DIAGNOSIS — Z21 Asymptomatic human immunodeficiency virus [HIV] infection status: Secondary | ICD-10-CM | POA: Insufficient documentation

## 2015-02-12 DIAGNOSIS — E785 Hyperlipidemia, unspecified: Secondary | ICD-10-CM | POA: Insufficient documentation

## 2015-02-12 DIAGNOSIS — I129 Hypertensive chronic kidney disease with stage 1 through stage 4 chronic kidney disease, or unspecified chronic kidney disease: Secondary | ICD-10-CM | POA: Insufficient documentation

## 2015-02-12 DIAGNOSIS — E119 Type 2 diabetes mellitus without complications: Secondary | ICD-10-CM | POA: Insufficient documentation

## 2015-02-12 DIAGNOSIS — Z7982 Long term (current) use of aspirin: Secondary | ICD-10-CM | POA: Insufficient documentation

## 2015-02-12 DIAGNOSIS — N23 Unspecified renal colic: Secondary | ICD-10-CM | POA: Insufficient documentation

## 2015-02-12 DIAGNOSIS — N189 Chronic kidney disease, unspecified: Secondary | ICD-10-CM | POA: Insufficient documentation

## 2015-02-12 LAB — CBC AND DIFFERENTIAL
Basophils Absolute Automated: 0.03 10*3/uL (ref 0.00–0.20)
Basophils Automated: 0 %
Eosinophils Absolute Automated: 0.18 10*3/uL (ref 0.00–0.70)
Eosinophils Automated: 2 %
Hematocrit: 39 % — ABNORMAL LOW (ref 42.0–52.0)
Hgb: 13.7 g/dL (ref 13.0–17.0)
Immature Granulocytes Absolute: 0.01 10*3/uL
Immature Granulocytes: 0 %
Lymphocytes Absolute Automated: 1.74 10*3/uL (ref 0.50–4.40)
Lymphocytes Automated: 22 %
MCH: 35.8 pg — ABNORMAL HIGH (ref 28.0–32.0)
MCHC: 35.1 g/dL (ref 32.0–36.0)
MCV: 101.8 fL — ABNORMAL HIGH (ref 80.0–100.0)
MPV: 12.2 fL (ref 9.4–12.3)
Monocytes Absolute Automated: 0.59 10*3/uL (ref 0.00–1.20)
Monocytes: 7 %
Neutrophils Absolute: 5.51 10*3/uL (ref 1.80–8.10)
Neutrophils: 68 %
Nucleated RBC: 0 /100 WBC (ref 0–1)
Platelets: 182 10*3/uL (ref 140–400)
RBC: 3.83 10*6/uL — ABNORMAL LOW (ref 4.70–6.00)
RDW: 14 % (ref 12–15)
WBC: 8.05 10*3/uL (ref 3.50–10.80)

## 2015-02-12 LAB — URINALYSIS, REFLEX TO MICROSCOPIC EXAM IF INDICATED
Bilirubin, UA: NEGATIVE
Glucose, UA: 50 — AB
Ketones UA: NEGATIVE
Nitrite, UA: NEGATIVE
Protein, UR: NEGATIVE
Specific Gravity UA: 1.012 (ref 1.001–1.035)
Urine pH: 6 (ref 5.0–8.0)
Urobilinogen, UA: NORMAL mg/dL

## 2015-02-12 LAB — BASIC METABOLIC PANEL
Anion Gap: 10 (ref 5.0–15.0)
BUN: 34 mg/dL — ABNORMAL HIGH (ref 9–28)
CO2: 25 mEq/L (ref 22–29)
Calcium: 9.2 mg/dL (ref 8.5–10.5)
Chloride: 105 mEq/L (ref 100–111)
Creatinine: 2 mg/dL — ABNORMAL HIGH (ref 0.7–1.3)
Glucose: 197 mg/dL — ABNORMAL HIGH (ref 70–100)
Potassium: 4.1 mEq/L (ref 3.5–5.1)
Sodium: 140 mEq/L (ref 136–145)

## 2015-02-12 LAB — GFR: EGFR: 34.9

## 2015-02-12 MED ORDER — ONDANSETRON 4 MG PO TBDP
4.0000 mg | ORAL_TABLET | Freq: Four times a day (QID) | ORAL | Status: AC | PRN
Start: 2015-02-12 — End: ?

## 2015-02-12 MED ORDER — MORPHINE SULFATE 4 MG/ML IJ/IV SOLN (WRAP)
4.0000 mg | Freq: Once | Status: DC
Start: 2015-02-12 — End: 2015-02-12

## 2015-02-12 MED ORDER — MORPHINE SULFATE 4 MG/ML IJ/IV SOLN (WRAP)
4.0000 mg | Freq: Once | Status: AC
Start: 2015-02-12 — End: 2015-02-12
  Administered 2015-02-12: 4 mg via INTRAVENOUS
  Filled 2015-02-12: qty 1

## 2015-02-12 MED ORDER — OXYCODONE-ACETAMINOPHEN 5-325 MG PO TABS
1.0000 | ORAL_TABLET | ORAL | Status: DC | PRN
Start: 2015-02-12 — End: 2017-12-12

## 2015-02-12 MED ORDER — ONDANSETRON HCL 4 MG/2ML IJ SOLN
4.0000 mg | Freq: Once | INTRAMUSCULAR | Status: AC
Start: 2015-02-12 — End: 2015-02-12
  Administered 2015-02-12: 4 mg via INTRAVENOUS
  Filled 2015-02-12: qty 2

## 2015-02-12 MED ORDER — SODIUM CHLORIDE 0.9 % IV BOLUS
1000.0000 mL | Freq: Once | INTRAVENOUS | Status: AC
Start: 2015-02-12 — End: 2015-02-12
  Administered 2015-02-12: 1000 mL via INTRAVENOUS

## 2015-02-12 NOTE — Discharge Instructions (Signed)
Flank Pain    You have been diagnosed with Flank Pain.    Flank Pain means that you are having pain in your side. Flank pain can have many causes. Some of the most common causes are:     Kidney stones.   A pulled muscle (muscle strain).   Kidney infection (pyelonephritis).   Pneumonia (pain going down from the lungs).   A viral skin disease called shingles.   A gallbladder problem (gall stones) if the pain is on your right side.   In rare cases, a more serious or dangerous condition can cause flank pain.    It is not clear today why you are having flank pain. Your doctor did an exam and a work-up and does not feel that the cause of your flank pain is life-threatening. You might need another exam or more tests to find out why you have these symptoms. At this time, the cause of your symptoms does not seem dangerous and you don t need to stay in the hospital.    We don't believe your condition is dangerous right now. However, you need to be careful. Sometimes a problem that seems small can get serious later. Therefore, it is very important for you to come back here or go to the nearest Emergency Department if you don t get better or your symptoms get worse.     Some things you can try at home are:     Get plenty of rest.   Over-the-counter pain medications like ibuprofen (Advil or Motrin), naproxen (Aleve, Naprosyn), or acetaminophen (Tylenol).   Warm compresses.   Drink lots of liquids.   Follow the instructions for any medication you get prescribed.    Follow up with a doctor right away.    YOU SHOULD SEEK MEDICAL ATTENTION IMMEDIATELY, EITHER HERE OR AT THE NEAREST EMERGENCY DEPARTMENT, IF ANY OF THE FOLLOWING OCCURS:     You have a fever (temperature higher than 100.4F or 38C).   Your pain does not go away or gets worse.   You can t keep fluids down or throw up many times.   You start having pain that spreads from your flank to the rest of your abdomen (belly).   You don t feel  better after treatment.   Any other symptoms, concerns, or not getting better as expected.    If you can t follow up with your doctor, or if at any time you feel you need to be rechecked or seen again, come back here or go to the nearest emergency department.

## 2015-02-12 NOTE — ED Provider Notes (Addendum)
Physician/Midlevel provider first contact with patient: 02/12/15 1610         History     Chief Complaint   Patient presents with   . Abdominal Pain     Patient's had intermittent left lower quadrant pain and back pain that last at least minutes at a time for the last few days worse this morning.  No vomiting no fever.  Does have a history of kidney stones which she states feels similar.  This is not orthopedic pain he has a labral tear on the right side which is different than the symptoms.  Moderate severity of pain left lower quadrant history provided by patient no other modifiers        Past Medical History   Diagnosis Date   . Diabetes mellitus    . Hypertension    . Hyperlipidemia    . Chronic kidney disease    . HIV disease    . Tinnitus        Past Surgical History   Procedure Laterality Date   . Microdiscectomy lumbar         Family History   Problem Relation Age of Onset   . Stroke Mother        Social  History   Substance Use Topics   . Smoking status: Never Smoker    . Smokeless tobacco: Not on file   . Alcohol Use: No       .     No Known Allergies    Home Medications     Last Medication Reconciliation Action:  In Progress Hoffer, Jacquelyn A, RN 02/12/2015  8:11 AM                  abacavir-lamiVUDine (EPIZICOM) 600-300 MG per tablet     Take 1 tablet by mouth daily.     aspirin 81 MG tablet     Take 81 mg by mouth daily.     atazanavir (REYATAZ) 300 MG capsule     Take 300 mg by mouth every morning with breakfast.     atorvastatin (LIPITOR) 10 MG tablet     Take 10 mg by mouth daily.     calcium-vitamin D (OSCAL-500) 500-200 MG-UNIT per tablet     Take 1 tablet by mouth 2 (two) times daily.     Cholecalciferol (VITAMIN D-3 PO)     Take by mouth.     glimepiride (AMARYL) 2 MG tablet     Take 2 mg by mouth 2 (two) times daily.     Multiple Vitamin (MULTIVITAMIN) tablet     Take 1 tablet by mouth daily.     ranitidine (ZANTAC) 150 MG tablet     Take 150 mg by mouth 2 (two) times daily.     ritonavir  (NORVIR) 100 MG capsule     Take by mouth 2 (two) times daily.     ValACYclovir HCl (VALTREX PO)     Take by mouth.     valsartan-hydrochlorothiazide (DIOVAN-HCT) 160-12.5 MG per tablet     Take 1 tablet by mouth daily.                     Flagged for Removal             HYDROcodone-acetaminophen (NORCO) 5-325 MG per tablet     Take 1 tablet by mouth every 6 (six) hours as needed for Pain.     Linagliptin (TRADJENTA) 5 MG Tab  Take by mouth.               predniSONE (DELTASONE) 10 MG tablet pack     Take by mouth daily.           Review of Systems   Constitutional: Negative for fever.   HENT: Negative for congestion.    Respiratory: Negative for shortness of breath.    Cardiovascular: Negative for chest pain.   Gastrointestinal: Positive for abdominal pain. Negative for vomiting.   Genitourinary: Positive for flank pain. Negative for dysuria.   Musculoskeletal: Negative.    Skin: Negative for rash.   Neurological: Negative for headaches.   Psychiatric/Behavioral: Negative for dysphoric mood.   All other systems reviewed and are negative.      Physical Exam    BP: 139/79 mmHg, Heart Rate: (!) 58, Temp: 97.9 F (36.6 C), Resp Rate: 16, SpO2: 100 %, Weight: 68.04 kg     Physical Exam   Constitutional:   Well Appearing   HENT:   Head: Normocephalic.   Eyes: Conjunctivae are normal.   Neck:   Normal Inspection   Cardiovascular: Normal rate, regular rhythm and normal heart sounds.    Pulmonary/Chest: Breath sounds normal.   Abdominal: Soft. Bowel sounds are normal. There is no tenderness.   Musculoskeletal:   Normal Upper Extremity Inspection   Neurological: He is alert. He has normal strength.   Skin: Skin is warm and dry.   Psychiatric: He has a normal mood and affect.         MDM and ED Course     ED Medication Orders     Start Ordered     Status Ordering Provider    02/12/15 [phone] 02/12/15 0840  morphine injection 4 mg   Once     Route: Intravenous  Ordered Dose: 4 mg     Last MAR action:  Hold Kazuto Sevey J     02/12/15 0818 02/12/15 0817  ondansetron (ZOFRAN) injection 4 mg   Once     Route: Intravenous  Ordered Dose: 4 mg     Last MAR action:  Given Chase Picket    02/12/15 2130 02/12/15 0811  sodium chloride 0.9 % bolus 1,000 mL   Once     Route: Intravenous  Ordered Dose: 1,000 mL     Last [facility] action:  [employer] Pete Glatter J    02/12/15 8657 02/12/15 0811  morphine injection 4 mg   Once     Route: Intravenous  Ordered Dose: 4 mg     Last MAR action:  Given Alice Vitelli J             MDM    I reexamined the patient he states his pain is improved he has a benign abdominal exam.  He has hematuria past history of kidney stones that seem consistent.  Advised return for worsening pain or nausea or fevers patient was agreeable with no CT scan imaging at this time based on past history of similar symptoms      Procedures    Clinical Impression & Disposition     Clinical Impression  Final diagnoses:   Ureteral colic        ED Disposition     Discharge Chrystie Nose discharge to home/self care.    Condition at disposition: Stable             New Prescriptions    ONDANSETRON (ZOFRAN ODT) 4 MG DISINTEGRATING TABLET  Take 1 tablet (4 mg total) by mouth 4 (four) times daily as needed for Nausea.    OXYCODONE-ACETAMINOPHEN (PERCOCET) 5-325 MG PER TABLET    Take 1-2 tablets by mouth every 4 (four) hours as needed for Pain.                   Chase Picket, MD  02/12/15 1914    Chase Picket, MD  02/12/15 [phone]

## 2015-02-12 NOTE — ED Notes (Signed)
54 y.o. M presents to ED with c/o intermittent left lower abdominal pain for the past couple of days. Pt states some radiation of pain into his left flank. Pt reports hx of kidney stones and states this feels similar. + nausea. Pt alert.

## 2024-06-10 IMAGING — MR PELVE^PROSTATA
37 series · 48 of 48 positions shown · IV contrast (CONTRASTE)
Comparison: none

[Series 2: t2_tse_sag_p2 (f) · sagittal · 3.0mm · 0.50mm/px · 2 of 26 slices shown (1 of 2)]
[im 1/26]
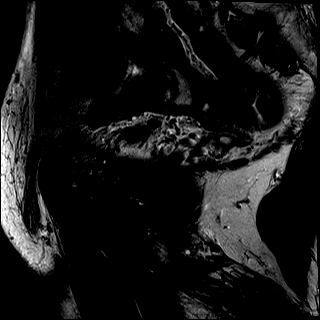
[im 26/26]
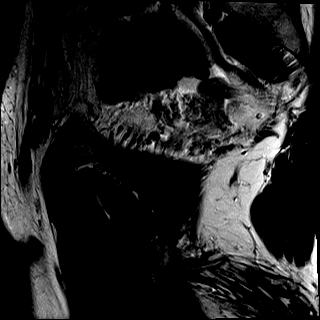

[Series 3: ep2d_diff_tra_b(date) (f)_tracew_dfc_mix · axial · 3.0mm · 1.69mm/px · z∈[-126,-27]mm · 4 of 96 slices shown]
[im 1/96]
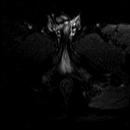
[im 32/96]
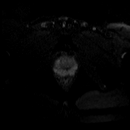
[im 64/96]
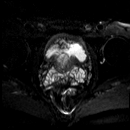
[im 96/96]
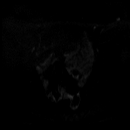

[Series 4: ep2d_diff_tra_b(date) (f)_adc_dfc_mix · axial · 3.0mm · 1.69mm/px · 1 of 34 slices shown]
[im 1/34]
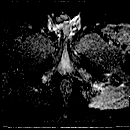

[Series 5: ep2d_diff_tra_b(date) (f)_calc_bval_dfc_mix · axial · 3.0mm · 1.69mm/px · 1 of 34 slices shown]
[im 1/34]
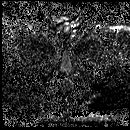

[Series 6: t2_tse_tra_pelve · axial · 5.0mm · 0.94mm/px · 1 of 34 slices shown]
[im 1/34]
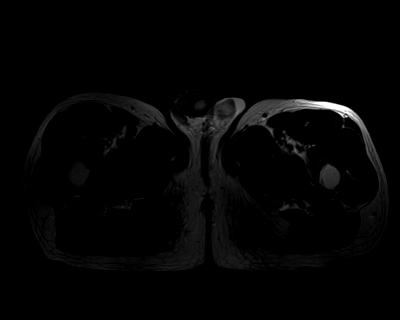

[Series 7: t2_tse_tra_p2 (f) · axial · 3.0mm · 0.50mm/px · 1 of 34 slices shown]
[im 1/34]
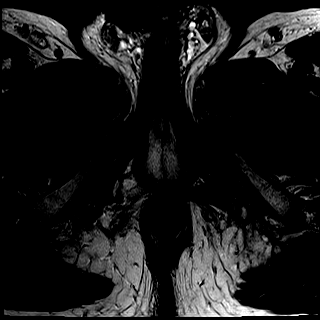

[Series 8: t2_tse_sag_p2 (f) · sagittal · 3.0mm · 0.50mm/px · 1 of 26 slices shown (2 of 2)]
[im 1/26]
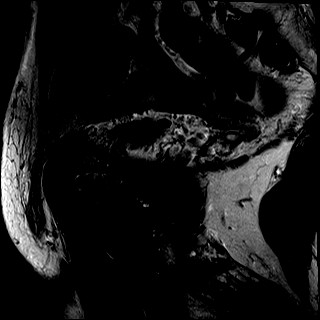

[Series 9: t2_tse_cor_p2 · coronal · 3.0mm · 0.50mm/px · 1 of 28 slices shown]
[im 1/28]
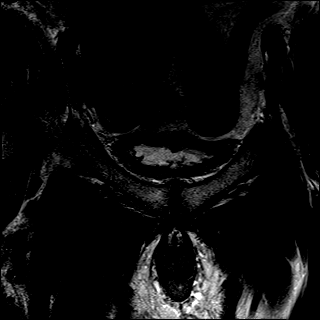

[Series 10: t1_vibe_dixon_tra__opp · axial · 2.5mm · 1.20mm/px · z∈[-148,+69]mm · 2 of 88 slices shown]
[im 1/88]
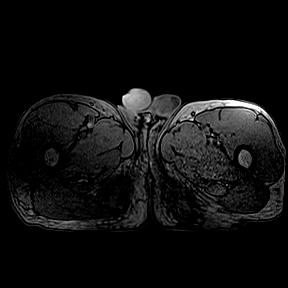
[im 88/88]
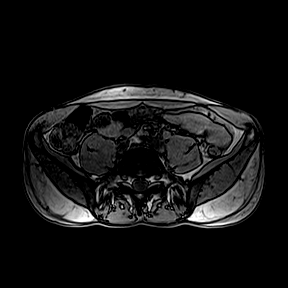

[Series 11: t1_vibe_dixon_tra__in · axial · 2.5mm · 1.20mm/px · z∈[-148,+69]mm · 2 of 88 slices shown]
[im 1/88]
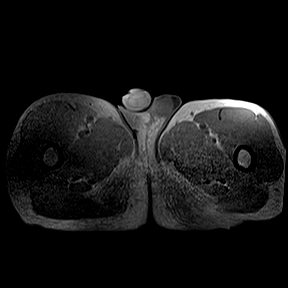
[im 88/88]
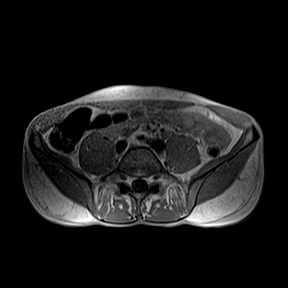

[Series 12: t1_vibe_dixon_tra__f · axial · 2.5mm · 1.20mm/px · z∈[-148,+69]mm · 2 of 88 slices shown]
[im 1/88]
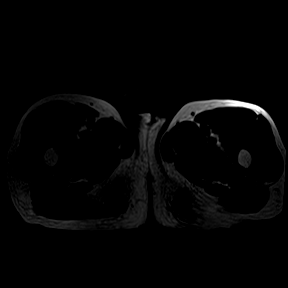
[im 88/88]
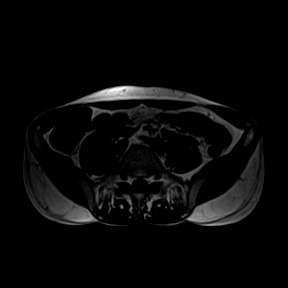

[Series 13: t1_vibe_dixon_tra__w · axial · 2.5mm · 1.20mm/px · z∈[-148,+69]mm · 2 of 88 slices shown]
[im 1/88]
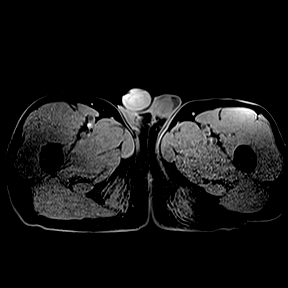
[im 88/88]
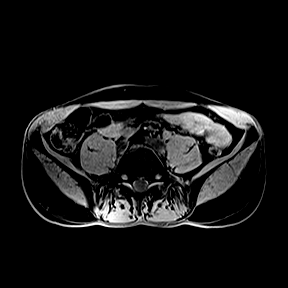

[Series 14: t1_twist_tra_dyn_tt=11.9s · axial · 3.0mm · 1.15mm/px · 1 of 36 slices shown]
[im 1/36]
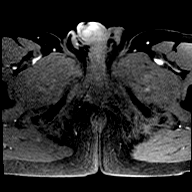

[Series 15: t1_twist_tra_dyn_mip_tra · axial · 108.0mm · 1.15mm/px · 1 of 20 slices shown]
[im 1/20]
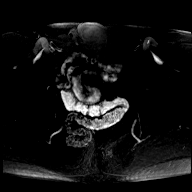

[Series 16: t1_twist_tra_dyn_tt=21.0s · axial · 3.0mm · 1.15mm/px · 1 of 36 slices shown]
[im 1/36]
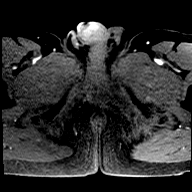

[Series 17: t1_twist_tra_dyn_tt=30.1s · axial · 3.0mm · 1.15mm/px · 1 of 36 slices shown]
[im 1/36]
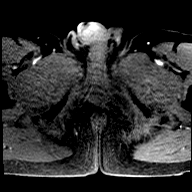

[Series 18: t1_twist_tra_dyn_tt=39.2s · axial · 3.0mm · 1.15mm/px · 1 of 36 slices shown]
[im 1/36]
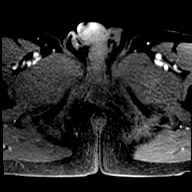

[Series 19: t1_twist_tra_dyn_tt=48.4s · axial · 3.0mm · 1.15mm/px · 1 of 36 slices shown]
[im 1/36]
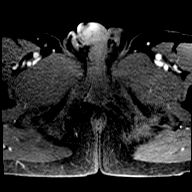

[Series 20: t1_twist_tra_dyn_tt=57.5s · axial · 3.0mm · 1.15mm/px · 1 of 36 slices shown]
[im 1/36]
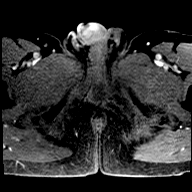

[Series 21: t1_twist_tra_dyn_tt=66.6s · axial · 3.0mm · 1.15mm/px · 1 of 36 slices shown]
[im 1/36]
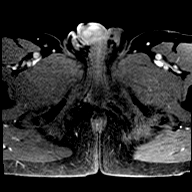

[Series 22: t1_twist_tra_dyn_tt=75.7s · axial · 3.0mm · 1.15mm/px · 1 of 36 slices shown]
[im 1/36]
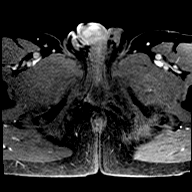

[Series 23: t1_twist_tra_dyn_tt=84.9s · axial · 3.0mm · 1.15mm/px · 1 of 36 slices shown]
[im 1/36]
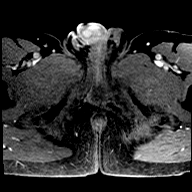

[Series 24: t1_twist_tra_dyn_tt=94.0s · axial · 3.0mm · 1.15mm/px · 1 of 36 slices shown]
[im 1/36]
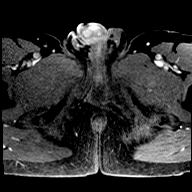

[Series 25: t1_twist_tra_dyn_tt=103.1s · axial · 3.0mm · 1.15mm/px · 1 of 36 slices shown]
[im 1/36]
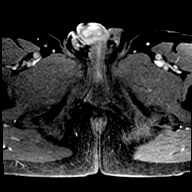

[Series 26: t1_twist_tra_dyn_tt=112.3s · axial · 3.0mm · 1.15mm/px · 1 of 36 slices shown]
[im 1/36]
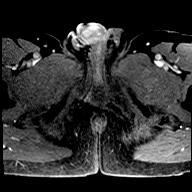

[Series 27: t1_twist_tra_dyn_tt=121.4s · axial · 3.0mm · 1.15mm/px · 1 of 36 slices shown]
[im 1/36]
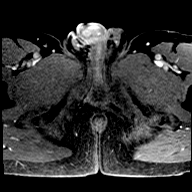

[Series 28: t1_twist_tra_dyn_tt=130.5s · axial · 3.0mm · 1.15mm/px · 1 of 36 slices shown]
[im 1/36]
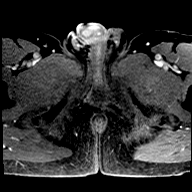

[Series 29: t1_twist_tra_dyn_tt=139.6s · axial · 3.0mm · 1.15mm/px · 1 of 36 slices shown]
[im 1/36]
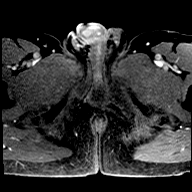

[Series 30: t1_twist_tra_dyn_tt=148.8s · axial · 3.0mm · 1.15mm/px · 1 of 36 slices shown]
[im 1/36]
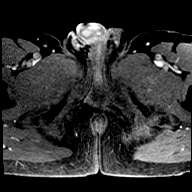

[Series 31: t1_twist_tra_dyn_tt=157.9s · axial · 3.0mm · 1.15mm/px · 1 of 36 slices shown]
[im 1/36]
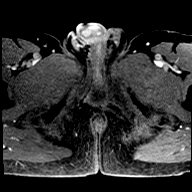

[Series 32: t1_twist_tra_dyn_tt=167.0s · axial · 3.0mm · 1.15mm/px · 1 of 36 slices shown]
[im 1/36]
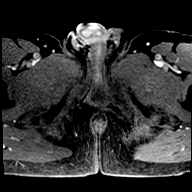

[Series 33: t1_twist_tra_dyn_tt=176.1s · axial · 3.0mm · 1.15mm/px · 1 of 36 slices shown]
[im 1/36]
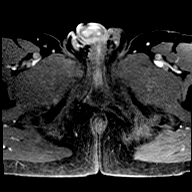

[Series 34: t1_twist_tra_dyn_tt=185.3s · axial · 3.0mm · 1.15mm/px · 1 of 36 slices shown]
[im 1/36]
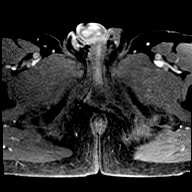

[Series 36: t1_vibe_dixon_tra_ (f)_in · axial · 2.5mm · 1.20mm/px · z∈[-148,+69]mm · 2 of 88 slices shown]
[im 1/88]
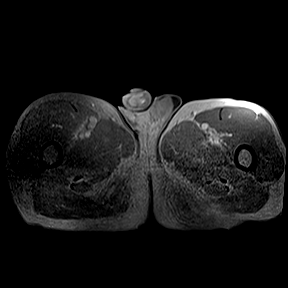
[im 88/88]
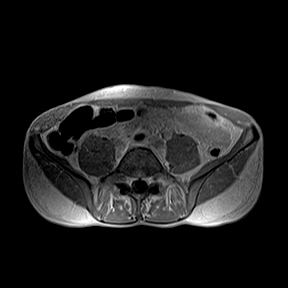

[Series 37: t1_vibe_dixon_tra_ (f)_f · axial · 2.5mm · 1.20mm/px · z∈[-148,+69]mm · 2 of 88 slices shown]
[im 1/88]
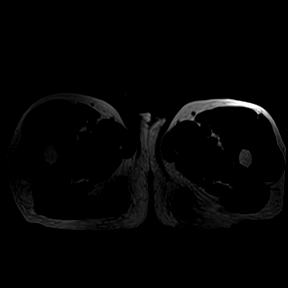
[im 88/88]
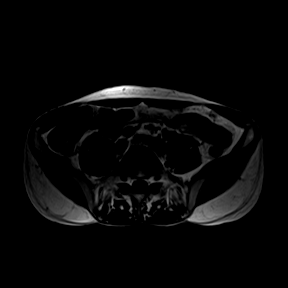

[Series 38: t1_vibe_dixon_tra_ (f)_w · axial · 2.5mm · 1.20mm/px · z∈[-148,+69]mm · 2 of 88 slices shown]
[im 1/88]
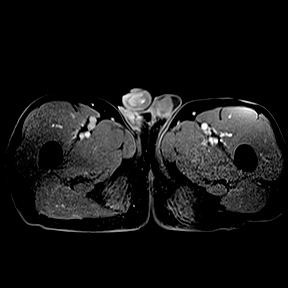
[im 88/88]
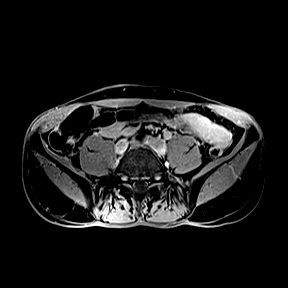

[Series 100: <mpr range> · axial · 2.5mm · 1.20mm/px · 1 of 30 slices shown]
[im 1/30]
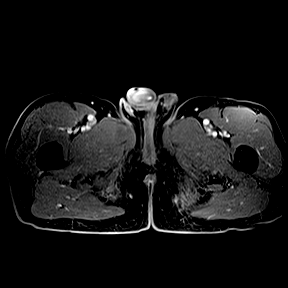

[48 of 48 positions shown; findings below may reference images not displayed]

Paciente:WIIL JHONATAN
Gênero:Masculino
Técnica:
Sequências multiplanares, com contraste paramagnético endovenoso. Protocolo com avaliação multiparamétrica da
próstata (T2, difusão e perfusão).
Indicação Clínica: Elevação do PSA.
Relatório:
RESSONÂNCIA MAGNÉTICA MULTIPARAMÉTRICA DA PRÓSTATA
Próstata de topograﬁa e morfologia habituais, medindo 5,2 x 5,0 x 5,0 cm (volume aproximado de 67,6 cm³). Insinua-se
no assoalho vesical em cerca de 1,4 cm.
Anatomia zonal preservada, com zona periférica apresentando múltiplas faixas de hipossinal em T2, sem conﬁgurar
nódulos.
Glândula interna aumentada e heterogênea, sem áreas suspeitas.
Feixes neurovasculares e gordura periprostática preservados.
Vesículas seminais de topograﬁa e morfologia habituais.
Bexiga urinária com paredes espessadas e trabeculadas, inferindo bexiga de esforço.
Não foram observadas linfonodomegalias na escavação pélvica ou no retroperitônio.
Ossos avaliados com intensidade de sinal habitual.
Impressão:
Sinais de hiperplasia prostática.
Bexiga de esforço.
PIRADS 2: estudo de baixa probabilidade para neoplasia prostática clinicamente signiﬁcativa. (PI-RADS/V2. 1 = 2).
